# Patient Record
Sex: Male | Born: 2000 | Race: White | Hispanic: No | Marital: Single | Smoking: Never smoker
Health system: Southern US, Community
[De-identification: ages and names within clinical notes are randomized; demographics above are authoritative.]

---
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
## 2019-07-19 ENCOUNTER — Other Ambulatory Visit: Payer: Self-pay

## 2019-07-19 ENCOUNTER — Emergency Department (HOSPITAL_BASED_OUTPATIENT_CLINIC_OR_DEPARTMENT_OTHER)
Admission: EM | Admit: 2019-07-19 | Discharge: 2019-07-19 | Disposition: A | Discharge status: Home / Self Care | Payer: 59 | Attending: Emergency Medicine | Admitting: Emergency Medicine

## 2019-07-19 ENCOUNTER — Encounter (HOSPITAL_BASED_OUTPATIENT_CLINIC_OR_DEPARTMENT_OTHER): Payer: Self-pay | Admitting: *Deleted

## 2019-07-19 ENCOUNTER — Emergency Department (HOSPITAL_BASED_OUTPATIENT_CLINIC_OR_DEPARTMENT_OTHER): Payer: 59

## 2019-07-19 DIAGNOSIS — Y998 Other external cause status: Secondary | ICD-10-CM | POA: Diagnosis not present

## 2019-07-19 DIAGNOSIS — Y92410 Unspecified street and highway as the place of occurrence of the external cause: Secondary | ICD-10-CM | POA: Diagnosis not present

## 2019-07-19 DIAGNOSIS — R0781 Pleurodynia: Secondary | ICD-10-CM | POA: Diagnosis not present

## 2019-07-19 DIAGNOSIS — Y9389 Activity, other specified: Secondary | ICD-10-CM | POA: Insufficient documentation

## 2019-07-19 DIAGNOSIS — R0789 Other chest pain: Secondary | ICD-10-CM | POA: Diagnosis present

## 2019-07-19 MED ORDER — NAPROXEN 500 MG PO TABS: 500.0000 mg | ORAL_TABLET | Freq: Two times a day (BID) | ORAL | 0 refills | Status: DC

## 2019-07-19 MED ORDER — METHOCARBAMOL 500 MG PO TABS: 500.0000 mg | ORAL_TABLET | Freq: Two times a day (BID) | ORAL | 0 refills | Status: DC
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
## 2019-07-19 NOTE — Discharge Instructions (Signed)
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
## 2020-03-11 ENCOUNTER — Ambulatory Visit (HOSPITAL_COMMUNITY)
Admission: EM | Admit: 2020-03-11 | Discharge: 2020-03-11 | Disposition: A | Discharge status: Home / Self Care | Payer: 59

## 2020-03-11 ENCOUNTER — Other Ambulatory Visit: Payer: Self-pay

## 2020-03-11 DIAGNOSIS — F4321 Adjustment disorder with depressed mood: Secondary | ICD-10-CM
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
## 2020-03-11 NOTE — ED Notes (Signed)
Patient belongings in locker # 17.

## 2020-03-11 NOTE — Discharge Instructions (Addendum)
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
## 2020-03-11 NOTE — ED Notes (Signed)
Patient discharged home. AVS/Follow-Up appointments reviewed with patient and written copy given to patient and he verbalized understanding. All patient belongings returned to him at discharge. Patient escorted off unit by staff.

## 2020-03-13 ENCOUNTER — Other Ambulatory Visit: Payer: Self-pay

## 2020-03-13 ENCOUNTER — Ambulatory Visit (HOSPITAL_COMMUNITY)
Admission: EM | Admit: 2020-03-13 | Discharge: 2020-03-14 | Disposition: A | Discharge status: Other Acute Inpatient Hospital | Payer: 59 | Attending: Nurse Practitioner | Admitting: Nurse Practitioner

## 2020-03-13 DIAGNOSIS — Z79899 Other long term (current) drug therapy: Secondary | ICD-10-CM | POA: Insufficient documentation

## 2020-03-13 DIAGNOSIS — R45851 Suicidal ideations: Secondary | ICD-10-CM | POA: Insufficient documentation

## 2020-03-13 DIAGNOSIS — Z20822 Contact with and (suspected) exposure to covid-19: Secondary | ICD-10-CM | POA: Insufficient documentation

## 2020-03-13 DIAGNOSIS — F419 Anxiety disorder, unspecified: Secondary | ICD-10-CM | POA: Insufficient documentation

## 2020-03-13 DIAGNOSIS — F332 Major depressive disorder, recurrent severe without psychotic features: Secondary | ICD-10-CM | POA: Insufficient documentation

## 2020-03-13 DIAGNOSIS — R450 Nervousness: Secondary | ICD-10-CM | POA: Insufficient documentation

## 2020-03-13 DIAGNOSIS — G4700 Insomnia, unspecified: Secondary | ICD-10-CM | POA: Insufficient documentation

## 2020-03-13 NOTE — ED Notes (Signed)
Patient has wallet, phone and keys in locker 29

## 2020-03-14 ENCOUNTER — Encounter (HOSPITAL_COMMUNITY): Payer: Self-pay | Admitting: Nurse Practitioner

## 2020-03-14 ENCOUNTER — Encounter (HOSPITAL_COMMUNITY): Payer: Self-pay

## 2020-03-14 ENCOUNTER — Inpatient Hospital Stay (HOSPITAL_COMMUNITY)
Admission: RE | Admit: 2020-03-14 | Discharge: 2020-03-16 | DRG: 885 | Disposition: A | Discharge status: Home / Self Care | Payer: 59 | Source: Intra-hospital | Attending: Psychiatry | Admitting: Psychiatry

## 2020-03-14 DIAGNOSIS — R450 Nervousness: Secondary | ICD-10-CM | POA: Diagnosis not present

## 2020-03-14 DIAGNOSIS — R45851 Suicidal ideations: Secondary | ICD-10-CM | POA: Diagnosis present

## 2020-03-14 DIAGNOSIS — F332 Major depressive disorder, recurrent severe without psychotic features: Principal | ICD-10-CM | POA: Diagnosis present

## 2020-03-14 DIAGNOSIS — Z818 Family history of other mental and behavioral disorders: Secondary | ICD-10-CM

## 2020-03-14 DIAGNOSIS — F419 Anxiety disorder, unspecified: Secondary | ICD-10-CM | POA: Diagnosis not present

## 2020-03-14 DIAGNOSIS — F411 Generalized anxiety disorder: Secondary | ICD-10-CM | POA: Diagnosis present

## 2020-03-14 DIAGNOSIS — G4700 Insomnia, unspecified: Secondary | ICD-10-CM | POA: Diagnosis present

## 2020-03-14 DIAGNOSIS — Z79899 Other long term (current) drug therapy: Secondary | ICD-10-CM | POA: Diagnosis not present

## 2020-03-14 DIAGNOSIS — Z20822 Contact with and (suspected) exposure to covid-19: Secondary | ICD-10-CM | POA: Diagnosis not present

## 2020-03-14 LAB — COMPREHENSIVE METABOLIC PANEL
ALT: 23 U/L (ref 0–44)
AST: 27 U/L (ref 15–41)
Albumin: 4.6 g/dL (ref 3.5–5.0)
Alkaline Phosphatase: 66 U/L (ref 38–126)
Anion gap: 13 (ref 5–15)
BUN: 11 mg/dL (ref 6–20)
CO2: 23 mmol/L (ref 22–32)
Calcium: 10 mg/dL (ref 8.9–10.3)
Chloride: 104 mmol/L (ref 98–111)
Creatinine, Ser: 0.94 mg/dL (ref 0.61–1.24)
GFR, Estimated: >60 mL/min (ref >60)
Glucose, Bld: 77 mg/dL (ref 70–99)
Potassium: 3.9 mmol/L (ref 3.5–5.1)
Sodium: 140 mmol/L (ref 135–145)
Total Bilirubin: 0.5 mg/dL (ref 0.3–1.2)
Total Protein: 7.7 g/dL (ref 6.5–8.1)

## 2020-03-14 LAB — CBC WITH DIFFERENTIAL/PLATELET
Abs Immature Granulocytes: 0.04 10*3/uL (ref 0.00–0.07)
Basophils Absolute: 0.1 10*3/uL (ref 0.0–0.1)
Basophils Relative: 0 %
Eosinophils Absolute: 0.2 10*3/uL (ref 0.0–0.5)
Eosinophils Relative: 2 %
HCT: 52 % (ref 39.0–52.0)
Hemoglobin: 17.5 g/dL — ABNORMAL HIGH (ref 13.0–17.0)
Immature Granulocytes: 0 %
Lymphocytes Relative: 27 %
Lymphs Abs: 3.2 10*3/uL (ref 0.7–4.0)
MCH: 29.1 pg (ref 26.0–34.0)
MCHC: 33.7 g/dL (ref 30.0–36.0)
MCV: 86.4 fL (ref 80.0–100.0)
Monocytes Absolute: 0.8 10*3/uL (ref 0.1–1.0)
Monocytes Relative: 7 %
Neutro Abs: 7.5 10*3/uL (ref 1.7–7.7)
Neutrophils Relative %: 64 %
Platelets: 407 10*3/uL — ABNORMAL HIGH (ref 150–400)
RBC: 6.02 MIL/uL — ABNORMAL HIGH (ref 4.22–5.81)
RDW: 12.3 % (ref 11.5–15.5)
WBC: 11.8 10*3/uL — ABNORMAL HIGH (ref 4.0–10.5)
nRBC: 0 % (ref 0.0–0.2)

## 2020-03-14 LAB — POCT URINE DRUG SCREEN - MANUAL ENTRY (I-SCREEN)
POC Buprenorphine (BUP): NOT DETECTED
POC Cocaine UR: NOT DETECTED
POC Methadone UR: NOT DETECTED
POC Methamphetamine UR: NOT DETECTED
POC Oxazepam (BZO): NOT DETECTED
POC Oxycodone UR: NOT DETECTED
POC Secobarbital (BAR): NOT DETECTED

## 2020-03-14 LAB — TSH: TSH: 2.79 u[IU]/mL (ref 0.350–4.500)

## 2020-03-14 LAB — LIPID PANEL
Cholesterol: 196 mg/dL (ref 0–200)
HDL: 58 mg/dL (ref >40)
LDL Cholesterol: 115 mg/dL — ABNORMAL HIGH (ref 0–99)
Total CHOL/HDL Ratio: 3.4 RATIO
Triglycerides: 117 mg/dL (ref <150)
VLDL: 23 mg/dL (ref 0–40)

## 2020-03-14 LAB — POCT URINE DRUG SCREEN - MANUAL ENTRY (I-CUP)
POC Amphetamine UR: NOT DETECTED
POC Marijuana UR: NOT DETECTED
POC Morphine: NOT DETECTED

## 2020-03-14 LAB — POC SARS CORONAVIRUS 2 AG -  ED: SARS Coronavirus 2 Ag: NEGATIVE

## 2020-03-14 LAB — RESPIRATORY PANEL BY RT PCR (FLU A&B, COVID)
Influenza A by PCR: NEGATIVE
Influenza B by PCR: NEGATIVE
SARS Coronavirus 2 by RT PCR: NEGATIVE

## 2020-03-14 MED ORDER — MAGNESIUM HYDROXIDE 400 MG/5ML PO SUSP: 30.0000 mL | Freq: Every day | ORAL | Status: DC | PRN

## 2020-03-14 MED ORDER — HYDROXYZINE HCL 25 MG PO TABS: 25.0000 mg | ORAL_TABLET | Freq: Three times a day (TID) | ORAL | Status: DC | PRN

## 2020-03-14 MED ORDER — ALUM & MAG HYDROXIDE-SIMETH 200-200-20 MG/5ML PO SUSP: 30.0000 mL | ORAL | Status: DC | PRN

## 2020-03-14 MED ORDER — TRAZODONE HCL 50 MG PO TABS: 50.0000 mg | ORAL_TABLET | Freq: Every evening | ORAL | Status: DC | PRN

## 2020-03-14 MED ORDER — ACETAMINOPHEN 325 MG PO TABS: 650.0000 mg | ORAL_TABLET | Freq: Four times a day (QID) | ORAL | Status: DC | PRN

## 2020-03-14 MED ORDER — SERTRALINE HCL 25 MG PO TABS
25.0000 mg | ORAL_TABLET | Freq: Every day | ORAL | Status: DC
  Administered 2020-03-14 – 2020-03-15 (×2): 25 mg via ORAL
  Filled 2020-03-14 (×4): qty 1

## 2020-03-14 MED ORDER — TRAZODONE HCL 50 MG PO TABS
50.0000 mg | ORAL_TABLET | Freq: Every evening | ORAL | Status: DC | PRN
  Administered 2020-03-14 – 2020-03-15 (×2): 50 mg via ORAL
  Filled 2020-03-14 (×2): qty 1
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
## 2020-03-14 NOTE — ED Triage Notes (Signed)
Pt arrives via private transportation for second time this evening with complaints of worsening depression and feeling of 'not wanting to be here'. Pt states he feels this way '90% of the time' but denies any plan or intent to harm himself at this time. A&Ox4, calm & cooperative, interacting appropriately with staff during assessment. Labs drawn in anticipation of transfer to [facility] later this evening, pt aware and in agreement of plan. Placed in obs while awaiting results.

## 2020-03-14 NOTE — Progress Notes (Signed)
Pt has been accepted to [facility] Room 302-02 to the service of MD Clary.  Pt may arrive as soon as negative COVID result received.  Call report to [phone] when transportation has been arranged.
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
## 2020-03-14 NOTE — Progress Notes (Signed)
Pt presented to medications window this AM A & O X4 with flat affect, depressed mood. Reports poor sleep last night with fair appetite. Rates his depression 7/10 and hopelessness 9/10. Stated to Clinical research associate "I'm not suicidal right this minute but I feel like if I leave here, yes it is possible I can hurt myself. This is because I don't feel like I have gotten the proper treatment yet". Pt states his goal for today is "to get better and find better ways to cope". Visible in scheduled groups and was engaged.  Support and encouragement offered to pt throughout this shift. Q 15 minutes safety checks maintained without incident. All medications administered with verbal education and effects monitored.  Pt is receptive to care and cooperative with unit routines. Tolerates all PO intake well. Denies concerns at this time.
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
## 2020-03-14 NOTE — Discharge Instructions (Addendum)
Patient has been accepted to [facility] for inpatient treatment.

## 2020-03-14 NOTE — BH Assessment (Signed)
Comprehensive Clinical Assessment (CCA) Note  03/14/2020 Travis Ramsey [id number] Pt drove himself to [facility] because of feelings that he would not be safe at home.  Patient says he has been feeling worse with his depression.    Patient says that he has had depression since age 19.  He is tired of not getting it properly addressed. Patient says that there are things from his past that cause his depression.  He says that he has a plan to kill himself by shooting himself.  Patient says there are guns in the house and he can get access to them.  Patient says that he drove himself to [facility] because being around those guns would be too tempting for him.   Patient denies any HI or A/V hallucinations.  He did say he has used some ETOH but none in the last 24 hours.  When he was assessed on 10/09 he indicated he had no history of ETOH use.    Patient demeanor is consistent with depression.  He has fair eye contact and is oriented x4.  Patient is not responding to internal stimuli.  Patient thought process is logical and coherent.  Patient reports appetite has dropped lately.  He has had less than 6 hours sleep at night.    Patient has no outpatient provider currently.  He does have a appt at [facility] on 10/19.  He says that he cannot go that long and is afraid he will kill himself before then.  Patient has no previous inpatient psychiatric care history.  -Nira Conn, FNP performed the [facility]  He recommended inpatient psychiatric care.  Pending a negative COVID result, pt may go to [facility].  Pt room assignment is [facility] 302-2 to Dr. Tamera Punt.    Visit Diagnosis:      ICD-10-CM   1. Major depressive disorder, recurrent severe without psychotic features (HCC)  F33.2       CCA Screening, Triage and Referral (STR)  Patient Reported Information How did you hear about [place]? Self (drove himself.)  Referral name: No data recorded Referral phone number: No data recorded  Whom do you see for routine medical problems? No data  recorded Practice/Facility Name: No data recorded Practice/Facility Phone Number: No data recorded Name of Contact: No data recorded Contact Number: No data recorded Contact Fax Number: No data recorded Prescriber Name: No data recorded Prescriber Address (if known): No data recorded  What Is the Reason for Your Visit/Call Today? Thoughts of killing himself.  Had plan to shoot himself.  How Long Has This Been Causing You Problems? > than 6 months  What Do You Feel Would Help You the Most Today? Therapy;Medication   Have You Recently Been in Any Inpatient Treatment (Hospital/Detox/Crisis Center/28-Day Program)? No  Name/Location of Program/Hospital:No data recorded How Long Were You There? No data recorded When Were You Discharged? No data recorded  Have You Ever Received Services From [facility] Before? Yes  Who Do You See at [facility]? [facility]   Have You Recently Had Any Thoughts About Hurting Yourself? Yes  Are You Planning to Commit Suicide/Harm Yourself At This time? Yes   Have you Recently Had Thoughts About Hurting Someone Karolee Ohs? No  Explanation: No data recorded  Have You Used Any Alcohol or Drugs in the Past 24 Hours? No  How Long Ago Did You Use Drugs or Alcohol? No data recorded What Did You Use and How Much? No data recorded  Do You Currently Have a Therapist/Psychiatrist? No  Name of Therapist/Psychiatrist: No data recorded  Have You Been Recently Discharged From Any Office Practice or Programs? No  Explanation of Discharge From Practice/Program: No data recorded    CCA Screening Triage Referral Assessment Type of Contact: Face-to-Face  Is this Initial or Reassessment? No data recorded Date Telepsych consult ordered in [facility]:  No data recorded Time Telepsych consult ordered in [facility]:  No data recorded  Patient Reported Information Reviewed? Yes  Patient Left Without Being Seen? No data recorded Reason for Not Completing Assessment: No data  recorded  Collateral Involvement: No data recorded  Does Patient Have a Court Appointed Legal Guardian? No data recorded Name and Contact of Legal Guardian: No data recorded If Minor and Not Living with Parent(s), Who has Custody? No data recorded Is CPS involved or ever been involved? Never  Is APS involved or ever been involved? Never   Patient Determined To Be At Risk for Harm To Self or Others Based on Review of Patient Reported Information or Presenting Complaint? Yes, for Self-Harm  Method: No data recorded Availability of Means: No data recorded Intent: No data recorded Notification Required: No data recorded Additional Information for Danger to Others Potential: No data recorded Additional Comments for Danger to Others Potential: No data recorded Are There Guns or Other Weapons in Your Home? No data recorded Types of Guns/Weapons: No data recorded Are These Weapons Safely Secured?                            No data recorded Who Could Verify You Are Able To Have These Secured: No data recorded Do You Have any Outstanding Charges, Pending Court Dates, Parole/Probation? No data recorded Contacted To Inform of Risk of Harm To Self or Others: No data recorded  Location of Assessment: [facility]   Does Patient Present under Involuntary Commitment? No  IVC Papers Initial File Date: No data recorded  [place] of Residence: [place]   Patient Currently Receiving the Following Services: Not Receiving Services (Does have an appt set up for 10/19 at [facility])   Determination of Need: Emergent (2 hours)   Options For Referral: Inpatient Hospitalization     CCA Biopsychosocial  Intake/Chief Complaint:     Mental Health Symptoms Depression:     Mania:     Anxiety:      Psychosis:     Trauma:     Obsessions:     Compulsions:     Inattention:     Hyperactivity/Impulsivity:     Oppositional/Defiant Behaviors:     Emotional Irregularity:     Other  Mood/Personality Symptoms:      Mental Status Exam Appearance and self-care  Stature:     Weight:     Clothing:     Grooming:     Cosmetic use:     Posture/gait:     Motor activity:     Sensorium  Attention:     Concentration:     Orientation:     Recall/memory:     Affect and Mood  Affect:     Mood:     Relating  Eye contact:     Facial expression:     Attitude toward examiner:     Thought and Language  Speech flow:    Thought content:     Preoccupation:     Hallucinations:     Organization:     Company secretary of Knowledge:  Intelligence:     Abstraction:     Judgement:     Reality Testing:     Insight:     Decision Making:     Social Functioning  Social Maturity:     Social Judgement:     Stress  Stressors:     Coping Ability:     Skill Deficits:     Supports:        Religion:    Leisure/Recreation:    Exercise/Diet:     CCA Employment/Education  Employment/Work Situation:    Education:     CCA Family/Childhood History  Family and Relationship History:    Childhood History:     Child/Adolescent Assessment:     CCA Substance Use  Alcohol/Drug Use:                           ASAM's:  Six Dimensions of Multidimensional Assessment  Dimension 1:  Acute Intoxication and/or Withdrawal Potential:      Dimension 2:  Biomedical Conditions and Complications:      Dimension 3:  Emotional, Behavioral, or Cognitive Conditions and Complications:     Dimension 4:  Readiness to Change:     Dimension 5:  Relapse, Continued use, or Continued Problem Potential:     Dimension 6:  Recovery/Living Environment:     ASAM Severity Score:    ASAM Recommended Level of Treatment:     Substance use Disorder (SUD)    Recommendations for Services/Supports/Treatments:    DSM5 Diagnoses: There are no problems to display for this patient.   Patient Centered Plan: Patient is on the following Treatment Plan(s):   Depression   Referrals to Alternative Service(s): Referred to Alternative Service(s):   Place:   Date:   Time:    Referred to Alternative Service(s):   Place:   Date:   Time:    Referred to Alternative Service(s):   Place:   Date:   Time:    Referred to Alternative Service(s):   Place:   Date:   Time:     [place]
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
## 2020-03-15 DIAGNOSIS — F332 Major depressive disorder, recurrent severe without psychotic features: Secondary | ICD-10-CM | POA: Diagnosis not present

## 2020-03-15 MED ORDER — SERTRALINE HCL 25 MG PO TABS
25.0000 mg | ORAL_TABLET | Freq: Every day | ORAL | Status: DC
  Administered 2020-03-15: 25 mg via ORAL
  Filled 2020-03-15 (×3): qty 1

## 2020-03-15 NOTE — Progress Notes (Signed)
Pt denies SI/HI/AVH.  Pt reported that he has benefited from attending groups and stated that he is glad he came to [facility] for assistance.  RN observed pt talking with other patients and interacting with others in the dayroom.  Pt took medications without incident.  Pt remains on q 15 min safety checks.  RN will continue to monitor and provide assistance as needed.

## 2020-03-15 NOTE — Progress Notes (Signed)
[facility] MD Progress Note  03/15/2020 11:16 AM Travis Ramsey  MRN:  [id number] Subjective: Patient is a 19 year old male with a past psychiatric history significant for depression and anxiety who originally presented to the [facility] on 03/11/2020 with depression and suicidal ideation.  Objective: Patient is seen and examined. Patient is a 19 year old male with the above-stated past psychiatric history who is seen in follow-up. He stated he is doing much better today. With admission being a 1 out of 10 for depression, he felt like he was a 9 out of 10 today. He stated he did feel as though the medications were making him feel sedated at during the day. He denied any auditory or visual hallucinations. He denied any suicidal or homicidal ideation. He is still requesting that he get psychotherapy as an outpatient, and we discussed that. All signs are stable, he is afebrile. He only slept 4 hours last night. His laboratories from 10/12 showed normal electrolytes including liver function enzyme and creatinine. Lipid panel was normal. His CBC was essentially normal. His TSH was 2.790.  Principal Problem: <principal problem not specified> Diagnosis: Active Problems:   Severe recurrent major depression without psychotic features ([place])  Total Time spent with patient: 20 minutes  Past Psychiatric History: See admission H&P  Past Medical History: History reviewed. No pertinent past medical history. History reviewed. No pertinent surgical history. Family History: History reviewed. No pertinent family history. Family Psychiatric  History: See admission H&P Social History:  Social History   Substance and Sexual Activity  Alcohol Use No     Social History   Substance and Sexual Activity  Drug Use Not Currently    Social History   Socioeconomic History  . Marital status: Single    Spouse name: Not on file  . Number of children: Not on file  . Years of education: Not on  file  . Highest education level: Not on file  Occupational History  . Not on file  Tobacco Use  . Smoking status: Never Smoker  . Smokeless tobacco: Never Used  Substance and Sexual Activity  . Alcohol use: No  . Drug use: Not Currently  . Sexual activity: Not Currently  Other Topics Concern  . Not on file  Social History Narrative  . Not on file   Social Determinants of Health   Financial Resource Strain:   . Difficulty of Paying Living Expenses: Not on file  Food Insecurity:   . Worried About Charity fundraiser in the Last Year: Not on file  . Ran Out of Food in the Last Year: Not on file  Transportation Needs:   . Lack of Transportation (Medical): Not on file  . Lack of Transportation (Non-Medical): Not on file  Physical Activity:   . Days of Exercise per Week: Not on file  . Minutes of Exercise per Session: Not on file  Stress:   . Feeling of Stress : Not on file  Social Connections:   . Frequency of Communication with Friends and Family: Not on file  . Frequency of Social Gatherings with Friends and Family: Not on file  . Attends Religious Services: Not on file  . Active Member of Clubs or Organizations: Not on file  . Attends Archivist Meetings: Not on file  . Marital Status: Not on file   Additional Social History:    History of alcohol / drug use?: No history of alcohol / drug abuse  Sleep: Fair  Appetite:  Good  Current Medications: Current Facility-Administered Medications  Medication Dose Route Frequency Provider Last Rate Last Admin  . acetaminophen (TYLENOL) tablet 650 mg  650 mg Oral Q6H PRN Rozetta Nunnery, NP      . alum & mag hydroxide-simeth (MAALOX/MYLANTA) 200-200-20 MG/5ML suspension 30 mL  30 mL Oral Q4H PRN Lindon Romp A, NP      . hydrOXYzine (ATARAX/VISTARIL) tablet 25 mg  25 mg Oral TID PRN Lindon Romp A, NP      . magnesium hydroxide (MILK OF MAGNESIA) suspension 30 mL  30 mL Oral Daily PRN Lindon Romp A, NP      . sertraline (ZOLOFT) tablet 25 mg  25 mg Oral Daily Sharma Covert, MD   25 mg at 03/15/20 0844  . traZODone (DESYREL) tablet 50 mg  50 mg Oral QHS PRN Rozetta Nunnery, NP   50 mg at 03/14/20 2309    Lab Results:  Results for orders placed or performed during the hospital encounter of 03/13/20 (from the past 48 hour(s))  POC SARS Coronavirus 2 Ag-ED - Nasal Swab (BD Veritor Kit)     Status: None (Preliminary result)   Collection Time: 03/14/20 12:26 AM  Result Value Ref Range   SARS Coronavirus 2 Ag Negative Negative  POCT Urine Drug Screen - (ICup)     Status: Normal (Preliminary result)   Collection Time: 03/14/20 12:34 AM  Result Value Ref Range   POC Amphetamine UR None Detected None Detected   POC Secobarbital (BAR) None Detected None Detected   POC Buprenorphine (BUP) None Detected None Detected   POC Oxazepam (BZO) None Detected None Detected   POC Cocaine UR None Detected None Detected   POC Methamphetamine UR None Detected None Detected   POC Morphine None Detected None Detected   POC Oxycodone UR None Detected None Detected   POC Methadone UR None Detected None Detected   POC Marijuana UR None Detected None Detected  CBC with Differential/Platelet     Status: Abnormal   Collection Time: 03/14/20 12:35 AM  Result Value Ref Range   WBC 11.8 (H) 4.0 - 10.5 K/uL   RBC 6.02 (H) 4.22 - 5.81 MIL/uL   Hemoglobin 17.5 (H) 13.0 - 17.0 g/dL   HCT 52.0 39 - 52 %   MCV 86.4 80.0 - 100.0 fL   MCH 29.1 26.0 - 34.0 pg   MCHC 33.7 30.0 - 36.0 g/dL   RDW 12.3 11.5 - 15.5 %   Platelets 407 (H) 150 - 400 K/uL   nRBC 0.0 0.0 - 0.2 %   Neutrophils Relative % 64 %   Neutro Abs 7.5 1.7 - 7.7 K/uL   Lymphocytes Relative 27 %   Lymphs Abs 3.2 0.7 - 4.0 K/uL   Monocytes Relative 7 %   Monocytes Absolute 0.8 0.1 - 1.0 K/uL   Eosinophils Relative 2 %   Eosinophils Absolute 0.2 0.0 - 0.5 K/uL   Basophils Relative 0 %   Basophils Absolute 0.1 0.0 - 0.1 K/uL   Immature  Granulocytes 0 %   Abs Immature Granulocytes 0.04 0.00 - 0.07 K/uL    Comment: Performed at [facility], [address], [place], [place] [address]  Comprehensive metabolic panel     Status: None   Collection Time: 03/14/20 12:35 AM  Result Value Ref Range   Sodium 140 135 - 145 mmol/L   Potassium 3.9 3.5 - 5.1 mmol/L   Chloride 104 98 -  111 mmol/L   CO2 23 22 - 32 mmol/L   Glucose, Bld 77 70 - 99 mg/dL    Comment: Glucose reference range applies only to samples taken after fasting for at least 8 hours.   BUN 11 6 - 20 mg/dL   Creatinine, Ser 0.94 0.61 - 1.24 mg/dL   Calcium 10.0 8.9 - 10.3 mg/dL   Total Protein 7.7 6.5 - 8.1 g/dL   Albumin 4.6 3.5 - 5.0 g/dL   AST 27 15 - 41 U/L   ALT 23 0 - 44 U/L   Alkaline Phosphatase 66 38 - 126 U/L   Total Bilirubin 0.5 0.3 - 1.2 mg/dL   GFR, Estimated >60 >60 mL/min   Anion gap 13 5 - 15    Comment: Performed at [facility] [address] [place], [place] [address]  Lipid panel     Status: Abnormal   Collection Time: 03/14/20 12:35 AM  Result Value Ref Range   Cholesterol 196 0 - 200 mg/dL   Triglycerides 117 <150 mg/dL   HDL 58 >40 mg/dL   Total CHOL/HDL Ratio 3.4 RATIO   VLDL 23 0 - 40 mg/dL   LDL Cholesterol 115 (H) 0 - 99 mg/dL    Comment:        Total Cholesterol/HDL:CHD Risk Coronary Heart Disease Risk Table                     Men   Women  1/2 Average Risk   3.4   3.3  Average Risk       5.0   4.4  2 X Average Risk   9.6   7.1  3 X Average Risk  23.4   11.0        Use the calculated Patient Ratio above and the CHD Risk Table to determine the patient's CHD Risk.        ATP III CLASSIFICATION (LDL):  <100     mg/dL   Optimal  100-129  mg/dL   Near or Above                    Optimal  130-159  mg/dL   Borderline  160-189  mg/dL   High  >190     mg/dL   Very High Performed at [facility] [address], [place], [place] [address]   TSH     Status: None   Collection Time: 03/14/20 12:35 AM   Result Value Ref Range   TSH 2.790 0.350 - 4.500 uIU/mL    Comment: Performed by a 3rd Generation assay with a functional sensitivity of <=0.01 uIU/mL. Performed at [facility], [place] [address] [place],  [address]   Respiratory Panel by RT PCR (Flu A&B, Covid) - Nasopharyngeal Swab     Status: None   Collection Time: 03/14/20 12:45 AM   Specimen: Nasopharyngeal Swab  Result Value Ref Range   SARS Coronavirus 2 by RT PCR NEGATIVE NEGATIVE    Comment: (NOTE) SARS-CoV-2 target nucleic acids are NOT DETECTED.  The SARS-CoV-2 RNA is generally detectable in upper respiratoy specimens during the acute phase of infection. The lowest concentration of SARS-CoV-2 viral copies this assay can detect is 131 copies/mL. A negative result does not preclude SARS-Cov-2 infection and should not be used as the sole basis for treatment or other patient management decisions. A negative result may occur with  improper specimen collection/handling, submission of specimen other than nasopharyngeal  swab, presence of viral mutation(s) within the areas targeted by this assay, and inadequate number of viral copies (<131 copies/mL). A negative result must be combined with clinical observations, patient history, and epidemiological information. The expected result is Negative.  Fact Sheet for Patients:  PinkCheek.be  Fact Sheet for Healthcare Providers:  GravelBags.it  This test is no t yet approved or cleared by the [place] FDA and  has been authorized for detection and/or diagnosis of SARS-CoV-2 by FDA under an Emergency Use Authorization (EUA). This EUA will remain  in effect (meaning this test can be used) for the duration of the COVID-19 declaration under Section 564(b)(1) of the Act, 21 U.S.C. section 360bbb-3(b)(1), unless the authorization is terminated or revoked sooner.     Influenza A by PCR NEGATIVE NEGATIVE    Influenza B by PCR NEGATIVE NEGATIVE    Comment: (NOTE) The Xpert Xpress SARS-CoV-2/FLU/RSV assay is intended as an aid in  the diagnosis of influenza from Nasopharyngeal swab specimens and  should not be used as a sole basis for treatment. Nasal washings and  aspirates are unacceptable for Xpert Xpress SARS-CoV-2/FLU/RSV  testing.  Fact Sheet for Patients: PinkCheek.be  Fact Sheet for Healthcare Providers: GravelBags.it  This test is not yet approved or cleared by the [place] FDA and  has been authorized for detection and/or diagnosis of SARS-CoV-2 by  FDA under an Emergency Use Authorization (EUA). This EUA will remain  in effect (meaning this test can be used) for the duration of the  Covid-19 declaration under Section 564(b)(1) of the Act, 21  U.S.C. section 360bbb-3(b)(1), unless the authorization is  terminated or revoked. Performed at [facility], [place] [address], [place], [place] [address]     Blood Alcohol level:  No results found for: [facility]  Metabolic Disorder Labs: No results found for: HGBA1C, MPG No results found for: PROLACTIN Lab Results  Component Value Date   CHOL 196 03/14/2020   TRIG 117 03/14/2020   HDL 58 03/14/2020   CHOLHDL 3.4 03/14/2020   VLDL 23 03/14/2020   LDLCALC 115 (H) 03/14/2020    Physical Findings: AIMS:  , ,  ,  ,    CIWA:    COWS:     Musculoskeletal: Strength & Muscle Tone: within normal limits Gait & Station: normal Patient leans: N/A  Psychiatric Specialty Exam: Physical Exam Vitals and nursing note reviewed.  Constitutional:      Appearance: Normal appearance.  HENT:     Head: Normocephalic and atraumatic.  Pulmonary:     Effort: Pulmonary effort is normal.  Neurological:     General: No focal deficit present.     Mental Status: He is alert and oriented to person, place, and time.     Review of Systems  All other systems reviewed and are  negative.   Blood pressure 133/69, pulse 68, temperature 98.5 F (36.9 C), temperature source Oral, resp. rate 20, height '5\' 4"'  (1.626 m), weight 81.6 kg, SpO2 99 %.Body mass index is 30.9 kg/m.  General Appearance: Casual  Eye Contact:  Fair  Speech:  Normal Rate  Volume:  Normal  Mood:  Euthymic  Affect:  Congruent  Thought Process:  Coherent and Descriptions of Associations: Intact  Orientation:  Full (Time, Place, and Person)  Thought Content:  Logical  Suicidal Thoughts:  No  Homicidal Thoughts:  No  Memory:  Immediate;   Fair Recent;   Fair Remote;   Fair  Judgement:  Intact  Insight:  Fair  Psychomotor Activity:  Normal  Concentration:  Concentration: Good and Attention Span: Good  Recall:  Good  Fund of Knowledge:  Good  Language:  Good  Akathisia:  Negative  Handed:  Right  AIMS (if indicated):     Assets:  Desire for Improvement Housing Resilience  ADL's:  Intact  Cognition:  WNL  Sleep:  Number of Hours: 4     Treatment Plan Summary: Daily contact with patient to assess and evaluate symptoms and progress in treatment, Medication management and Plan : Patient is seen and examined. Patient is a 19 year old male with the above-stated past psychiatric history who is seen in follow-up.   Diagnosis: 1. Major depression, recurrent, severe without psychotic features 2. Posttraumatic stress disorder 3. Generalized anxiety disorder  Pertinent findings on examination today: 1. Improved mood and anxiety. 2. No suicidal ideation. 3. Mild sedation with sertraline.  Plan: 1. Switch sertraline to 25 mg p.o. nightly for anxiety and depression. 2. Continue trazodone 50 mg p.o. nightly as needed. 3. Disposition planning-hopefully discharge tomorrow.  Sharma Covert, MD 03/15/2020, 11:16 AM

## 2020-03-15 NOTE — [facility] Group Notes (Signed)
[facility] LCSW Group Therapy  03/15/2020 11:41 AM  Type of Therapy:  Self Care for Depression   Participation Level:  Did Not Attend  Participation Quality:  Did not attend  Affect:  Did not attend   Cognitive:  Did not attend   Insight:  Did not attend   Engagement in Therapy:  Did not attend   Modes of Intervention:  Did not attend   Summary of Progress/Problems: Pt did not attend the group   Aram Beecham 03/15/2020, 11:41 AM

## 2020-03-15 NOTE — Progress Notes (Signed)
Recreation Therapy Notes  Date:  10.13.21 Time: 0930 Location: 300 Group Room  Group Topic: Stress Management  Goal Area(s) Addresses:  Patient will identify positive stress management techniques. Patient will identify benefits of using stress management post d/c.  Behavioral Response: Engaged  Intervention: Stress Management  Activity: Guided Imagery.  LRT read a script that led patients on a journey to envision their peaceful place.  Patients were imagine all the things that made their place peaceful to them.  Education:  Stress Management, Discharge Planning.   Education Outcome: Acknowledges Education  Clinical Observations/Feedback:  Pt attended and participated in activity.    Caroll Rancher, LRT/CTRS        Lillia Abed, Zamia Tyminski A 03/15/2020 10:08 AM

## 2020-03-15 NOTE — Tx Team (Signed)
Interdisciplinary Treatment and Diagnostic Plan Update  03/15/2020 Time of Session: 9:05am Travis Ramsey MRN: [id number]  Principal Diagnosis: <principal problem not specified>  Secondary Diagnoses: Active Problems:   Severe recurrent major depression without psychotic features (HCC)   Current Medications:  Current Facility-Administered Medications  Medication Dose Route Frequency Provider Last Rate Last Admin   acetaminophen (TYLENOL) tablet 650 mg  650 mg Oral Q6H PRN Jackelyn Poling, NP       alum & mag hydroxide-simeth (MAALOX/MYLANTA) 200-200-20 MG/5ML suspension 30 mL  30 mL Oral Q4H PRN Nira Conn A, NP       hydrOXYzine (ATARAX/VISTARIL) tablet 25 mg  25 mg Oral TID PRN Jackelyn Poling, NP       magnesium hydroxide (MILK OF MAGNESIA) suspension 30 mL  30 mL Oral Daily PRN Nira Conn A, NP       sertraline (ZOLOFT) tablet 25 mg  25 mg Oral Daily Antonieta Pert, MD   25 mg at 03/15/20 0844   traZODone (DESYREL) tablet 50 mg  50 mg Oral QHS PRN Jackelyn Poling, NP   50 mg at 03/14/20 2309   PTA Medications: Medications Prior to Admission  Medication Sig Dispense Refill Last Dose   ibuprofen (ADVIL,MOTRIN) 600 MG tablet Take 600 mg by mouth every 6 (six) hours as needed.       Patient Stressors:    Patient Strengths:    Treatment Modalities: Medication Management, Group therapy, Case management,  1 to 1 session with clinician, Psychoeducation, Recreational therapy.   Physician Treatment Plan for Primary Diagnosis: <principal problem not specified> Long Term Goal(s): Improvement in symptoms so as ready for discharge Improvement in symptoms so as ready for discharge   Short Term Goals: Ability to identify changes in lifestyle to reduce recurrence of condition will improve Ability to verbalize feelings will improve Ability to disclose and discuss suicidal ideas Ability to demonstrate self-control will improve Ability to identify and develop effective coping behaviors  will improve Ability to maintain clinical measurements within normal limits will improve Ability to identify changes in lifestyle to reduce recurrence of condition will improve Ability to verbalize feelings will improve Ability to disclose and discuss suicidal ideas Ability to demonstrate self-control will improve Ability to identify and develop effective coping behaviors will improve Ability to maintain clinical measurements within normal limits will improve  Medication Management: Evaluate patient's response, side effects, and tolerance of medication regimen.  Therapeutic Interventions: 1 to 1 sessions, Unit Group sessions and Medication administration.  Evaluation of Outcomes: Progressing  Physician Treatment Plan for Secondary Diagnosis: Active Problems:   Severe recurrent major depression without psychotic features (HCC)  Long Term Goal(s): Improvement in symptoms so as ready for discharge Improvement in symptoms so as ready for discharge   Short Term Goals: Ability to identify changes in lifestyle to reduce recurrence of condition will improve Ability to verbalize feelings will improve Ability to disclose and discuss suicidal ideas Ability to demonstrate self-control will improve Ability to identify and develop effective coping behaviors will improve Ability to maintain clinical measurements within normal limits will improve Ability to identify changes in lifestyle to reduce recurrence of condition will improve Ability to verbalize feelings will improve Ability to disclose and discuss suicidal ideas Ability to demonstrate self-control will improve Ability to identify and develop effective coping behaviors will improve Ability to maintain clinical measurements within normal limits will improve     Medication Management: Evaluate patient's response, side effects, and tolerance of medication regimen.  Therapeutic  Interventions: 1 to 1 sessions, Unit Group sessions and  Medication administration.  Evaluation of Outcomes: Progressing   RN Treatment Plan for Primary Diagnosis: <principal problem not specified> Long Term Goal(s): Knowledge of disease and therapeutic regimen to maintain health will improve  Short Term Goals: Ability to remain free from injury will improve, Ability to verbalize feelings will improve, Ability to disclose and discuss suicidal ideas and Ability to identify and develop effective coping behaviors will improve  Medication Management: RN will administer medications as ordered by provider, will assess and evaluate patient's response and provide education to patient for prescribed medication. RN will report any adverse and/or side effects to prescribing provider.  Therapeutic Interventions: 1 on 1 counseling sessions, Psychoeducation, Medication administration, Evaluate responses to treatment, Monitor vital signs and CBGs as ordered, Perform/monitor CIWA, COWS, AIMS and Fall Risk screenings as ordered, Perform wound care treatments as ordered.  Evaluation of Outcomes: Progressing   LCSW Treatment Plan for Primary Diagnosis: <principal problem not specified> Long Term Goal(s): Safe transition to appropriate next level of care at discharge, Engage patient in therapeutic group addressing interpersonal concerns.  Short Term Goals: Engage patient in aftercare planning with referrals and resources, Increase social support, Increase ability to appropriately verbalize feelings, Identify triggers associated with mental health/substance abuse issues and Increase skills for wellness and recovery  Therapeutic Interventions: Assess for all discharge needs, 1 to 1 time with Social worker, Explore available resources and support systems, Assess for adequacy in community support network, Educate family and significant other(s) on suicide prevention, Complete Psychosocial Assessment, Interpersonal group therapy.  Evaluation of Outcomes:  Progressing   Progress in Treatment: Attending groups: No. Participating in groups: No. Taking medication as prescribed: Yes. Toleration medication: Yes. Family/Significant other contact made: No, will contact:  Declined consents  Patient understands diagnosis: Yes. Discussing patient identified problems/goals with staff: Yes. Medical problems stabilized or resolved: Yes. Denies suicidal/homicidal ideation: Yes. Issues/concerns per patient self-inventory: No.   New problem(s) identified: No, Describe:  None  New Short Term/Long Term Goal(s): medication stabilization, elimination of SI thoughts, development of comprehensive mental wellness plan.   Patient Goals:  "Better ways to cope and to be less depressed"  Discharge Plan or Barriers: Patient recently admitted. CSW will continue to follow and assess for appropriate referrals and possible discharge planning.   Reason for Continuation of Hospitalization: Depression Medication stabilization Suicidal ideation  Estimated Length of Stay: 3 to 5 days   Attendees: Patient: Travis Ramsey 03/15/2020   Physician:  03/15/2020   Nursing:  03/15/2020   RN Care Manager: Marciano Sequin, NP 03/15/2020   Social Worker: Ruthann Cancer, LCSW 03/15/2020   Recreational Therapist:  03/15/2020   Other:  03/15/2020  Other:  03/15/2020   Other: 03/15/2020      Scribe for Treatment Team: Aram Beecham, LCSWA 03/15/2020 9:28 AM

## 2020-03-15 NOTE — Progress Notes (Signed)
   03/14/20 2300  Psych Admission Type (Psych Patients Only)  Admission Status Voluntary  Psychosocial Assessment  Patient Complaints Anxiety;Sadness  Eye Contact Fair  Facial Expression Sad  Affect Depressed;Appropriate to circumstance  Speech Soft  Interaction Assertive  Motor Activity Other (Comment) (wnl)  Appearance/Hygiene Unremarkable  Behavior Characteristics Cooperative;Appropriate to situation  Mood Anxious;Sad  Thought Process  Coherency WDL  Content Blaming self  Delusions None reported or observed  Perception WDL  Hallucination None reported or observed  Judgment Poor  Confusion None  Danger to Self  Current suicidal ideation? Denies  Danger to Others  Danger to Others None reported or observed   Pt denies SI, HI, AVH and pain. Pt endorses anxiety 5/10. Pt states that he feels better today after attending groups and talking with other pts and the staff. "This is a good place for me right now. I want to learn how to cope with my feelings and to have someone to speak with ." Pt is interested in therapy as well as medication management. Pt did share that he doesn't take Benadryl or medications like it because "my mother overdosed me on Benadryl as a child. I don't have good memories of using it." Pt refused Vistaril for anxiety this evening.

## 2020-03-16 MED ORDER — HYDROXYZINE HCL 25 MG PO TABS
25.0000 mg | ORAL_TABLET | Freq: Three times a day (TID) | ORAL | 0 refills | Status: AC | PRN
Start: 2020-03-16 — End: ?

## 2020-03-16 MED ORDER — TRAZODONE HCL 50 MG PO TABS
50.0000 mg | ORAL_TABLET | Freq: Every evening | ORAL | 0 refills | Status: AC | PRN
Start: 2020-03-16 — End: ?

## 2020-03-16 MED ORDER — SERTRALINE HCL 25 MG PO TABS
25.0000 mg | ORAL_TABLET | Freq: Every day | ORAL | 0 refills | Status: AC
Start: 2020-03-16 — End: ?

## 2020-03-16 NOTE — [facility] Suicide Risk Assessment (Signed)
[facility] Discharge Suicide Risk Assessment   Principal Problem: <principal problem not specified> Discharge Diagnoses: Active Problems:   Severe recurrent major depression without psychotic features (HCC)   Total Time spent with patient: 15 minutes  Musculoskeletal: Strength & Muscle Tone: within normal limits Gait & Station: normal Patient leans: N/A  Psychiatric Specialty Exam: Review of Systems  All other systems reviewed and are negative.   Blood pressure 118/71, pulse (!) 55, temperature 98.2 F (36.8 C), temperature source Oral, resp. rate 20, height 5\' 4"  (1.626 m), weight 81.6 kg, SpO2 99 %.Body mass index is 30.9 kg/m.  General Appearance: Casual  Eye Contact::  Good  Speech:  Normal Rate409  Volume:  Normal  Mood:  Euthymic  Affect:  Appropriate  Thought Process:  Coherent and Descriptions of Associations: Intact  Orientation:  Full (Time, Place, and Person)  Thought Content:  Logical  Suicidal Thoughts:  No  Homicidal Thoughts:  No  Memory:  Immediate;   Good Recent;   Good Remote;   Good  Judgement:  Intact  Insight:  Fair  Psychomotor Activity:  Normal  Concentration:  Good  Recall:  Good  Fund of Knowledge:Good  Language: Good  Akathisia:  Negative  Handed:  Right  AIMS (if indicated):     Assets:  Desire for Improvement Housing Resilience Social Support  Sleep:  Number of Hours: 4  Cognition: WNL  ADL's:  Intact   Mental Status Per Nursing Assessment::   On Admission:  Suicidal ideation indicated by patient, Belief that plan would result in death  Demographic Factors:  Male and Caucasian  Loss Factors: Financial problems/change in socioeconomic status  Historical Factors: Impulsivity  Risk Reduction Factors:   Living with another person, especially a relative and Positive social support  Continued Clinical Symptoms:  Depression:   Impulsivity  Cognitive Features That Contribute To Risk:  None    Suicide Risk:  Minimal: No  identifiable suicidal ideation.  Patients presenting with no risk factors but with morbid ruminations; may be classified as minimal risk based on the severity of the depressive symptoms   Follow-up Information    [facility] Follow up.   Why: A referral has been made on your behalf. Contact information: [address] [place] [phone]               Plan Of Care/Follow-up recommendations:  Activity:  ad lib  [phone], MD 03/16/2020, 8:22 AM

## 2020-03-16 NOTE — Progress Notes (Signed)
RN met with pt and reviewed pt's discharge instructions.  Pt verbalized understanding of discharge instructions and pt did not have any questions. RN reviewed and provided pt with a copy of SRA, AVS and Transition Record.  RN returned pt's belongings to pt.  Pt denied SI/HI/AVH and voiced no concerns.  Pt was appreciative of the care pt received at [facility].  Patient discharged to the lobby without incident. 

## 2020-03-16 NOTE — Progress Notes (Signed)
°  [facility] Adult Case Management Discharge Plan :  Will you be returning to the same living situation after discharge:  Yes,  Home  At discharge, do you have transportation home?: Yes,  Safe Transport  Do you have the ability to pay for your medications: Yes,  Insurance   Release of information consent forms completed and in the chart;  Patient's signature needed at discharge.  Patient to Follow up at:  Follow-up Information    [facility] Follow up.   Why: A referral has been made on your behalf. Contact information: [address] [place] [address] [phone]               Next level of care provider has access to [facility] Link:no  Safety Planning and Suicide Prevention discussed: Yes,  with patient      Has patient been referred to the Quitline?: Patient refused referral  Patient has been referred for addiction treatment: N/A  Aram Beecham, LCSWA 03/16/2020, 9:43 AM

## 2020-03-16 NOTE — Progress Notes (Signed)
   03/15/20 2100  Psych Admission Type (Psych Patients Only)  Admission Status Voluntary  Psychosocial Assessment  Patient Complaints Anxiety  Eye Contact Fair  Facial Expression Anxious;Sad  Affect Depressed;Appropriate to circumstance  Speech Soft  Interaction Assertive  Motor Activity Other (Comment) (wnl)  Appearance/Hygiene Unremarkable  Behavior Characteristics Cooperative;Anxious;Appropriate to situation  Mood Anxious  Thought Process  Coherency WDL  Content WDL  Delusions None reported or observed  Perception WDL  Hallucination None reported or observed  Judgment WDL  Confusion None  Danger to Self  Current suicidal ideation? Denies  Danger to Others  Danger to Others None reported or observed   Pt states he had a good day. Rates anxiety 7/10. Pt attended groups. Stated there weren't as many on 400 as the day before.

## 2020-03-16 NOTE — [facility] Suicide Risk Assessment (Signed)
[facility] INPATIENT:  Family/Significant Other Suicide Prevention Education  Suicide Prevention Education:  Patient Refusal for Family/Significant Other Suicide Prevention Education: The patient Lambert Jeanty has refused to provide written consent for family/significant other to be provided Family/Significant Other Suicide Prevention Education during admission and/or prior to discharge.  Physician notified.   SPE completed with patient and pamphlet given for patient's review.     [place] Kung Tej Murdaugh 03/16/2020, 9:46 AM
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
## 2020-03-21 ENCOUNTER — Telehealth (HOSPITAL_COMMUNITY): Payer: Self-pay | Admitting: Licensed Clinical Social Worker

## 2020-03-21 ENCOUNTER — Ambulatory Visit (HOSPITAL_COMMUNITY): Payer: 59 | Admitting: Licensed Clinical Social Worker

## 2020-03-21 NOTE — Telephone Encounter (Signed)
Travis Ramsey had an in-person clinical assessment scheduled today for 9am.  Clinician contacted him by phone at 9:07am when he had not presented on time.  Travis Ramsey did not answer phone call, so clinician left voicemail reminding him of this appointment, and provided contacted numbers for office and front desk staff.  Clinician informed front desk of no-show event when Travis Ramsey did not return call or appear for appointment by 9:15am.    Noralee Stain, LCSW, LCAS 03/21/20

## 2020-04-12 ENCOUNTER — Other Ambulatory Visit (HOSPITAL_COMMUNITY): Payer: Self-pay | Admitting: Psychiatry
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
